# Patient Record
Sex: Male | Born: 1975 | Hispanic: Yes | Marital: Single | Smoking: Never smoker
Health system: Southern US, Community
[De-identification: ages and names within clinical notes are randomized; demographics above are authoritative.]

## PROBLEM LIST (undated history)

## (undated) DIAGNOSIS — T7840XA Allergy, unspecified, initial encounter: Secondary | ICD-10-CM

## (undated) DIAGNOSIS — K219 Gastro-esophageal reflux disease without esophagitis: Secondary | ICD-10-CM

## (undated) HISTORY — DX: Gastro-esophageal reflux disease without esophagitis: K21.9

## (undated) HISTORY — DX: Allergy, unspecified, initial encounter: T78.40XA

---

## 2013-03-06 ENCOUNTER — Ambulatory Visit (INDEPENDENT_AMBULATORY_CARE_PROVIDER_SITE_OTHER): Payer: Self-pay | Admitting: Adult Health

## 2013-03-06 ENCOUNTER — Encounter: Payer: Self-pay | Admitting: Adult Health

## 2013-03-06 VITALS — BP 130/76 | HR 58 | Temp 98.2°F | Resp 12 | Ht 67.25 in | Wt 166.0 lb

## 2013-03-06 DIAGNOSIS — R1013 Epigastric pain: Secondary | ICD-10-CM

## 2013-03-06 DIAGNOSIS — K219 Gastro-esophageal reflux disease without esophagitis: Secondary | ICD-10-CM | POA: Insufficient documentation

## 2013-03-06 MED ORDER — RANITIDINE HCL 150 MG PO TABS: 150.0000 mg | ORAL_TABLET | Freq: Two times a day (BID) | ORAL | Status: DC

## 2013-03-06 MED ORDER — OMEPRAZOLE MAGNESIUM 20 MG PO TBEC: DELAYED_RELEASE_TABLET | ORAL | Status: DC

## 2013-03-06 NOTE — Assessment & Plan Note (Signed)
Start prilosec 20 mg bid, zantac 150 mg bid. Check cbc w/diff and H.pylori stool antigen. Eliminate foods that patient finds irritating. Try to eat at least every 2-3 hours. If no improvement in therapy within 4-8 weeks will refer to GI.

## 2013-03-06 NOTE — Progress Notes (Signed)
Subjective:    Patient ID: Steve Cline, male    DOB: 06/04/1976, 37 y.o.   MRN: [id number]  HPI  Patient is a pleasant Hispanic male who presents to clinic to establish care and also to discuss symptoms of GERD. He reports that two weeks ago he began to have symptoms of acid reflux and bitter, acid taste in his mouth. He started taking Tums and reports temporary relief. He was also having increased belching and bloating. The symptoms occur shortly after eating a meal. He was seen in [place] approximately 2 years ago and diagnosed with H. pylori.  He reports being treated for same and symptoms resolved. Elita Quick has started taking Prilosec OTC 20 mg in the morning recently.   Past Medical History  Diagnosis Date  . Allergy   . GERD (gastroesophageal reflux disease)     Family History  Problem Relation Age of Onset  . Diabetes Mother   . Hyperlipidemia Mother   . Hypertension Mother     History   Social History  . Marital Status: Single    Spouse Name: N/A    Number of Children: N/A  . Years of Education: N/A   Occupational History  . Not on file.   Social History Main Topics  . Smoking status: Never Smoker   . Smokeless tobacco: Never Used  . Alcohol Use: Yes  . Drug Use: No  . Sexually Active: Not on file   Other Topics Concern  . Not on file   Social History Narrative  . No narrative on file     Review of Systems  Constitutional: Positive for fatigue. Negative for fever.  HENT: Negative.  Negative for voice change.   Respiratory: Negative.   Cardiovascular: Negative.   Gastrointestinal: Positive for constipation. Negative for nausea, abdominal pain, diarrhea and blood in stool.       Acid taste in mouth  Genitourinary: Negative.   Neurological: Negative.   Psychiatric/Behavioral: Negative.   All other systems reviewed and are negative.   BP 130/76  Pulse 58  Temp(Src) 98.2 F (36.8 C) (Oral)  Resp 12  Ht 5' 7.25" (1.708 m)  Wt 166 lb (75.297  kg)  BMI 25.81 kg/m2  SpO2 98%    Objective:   Physical Exam  Constitutional: He is oriented to person, place, and time. He appears well-developed and well-nourished. No distress.  HENT:  Head: Normocephalic and atraumatic.  Right Ear: External ear normal.  Left Ear: External ear normal.  Nose: Nose normal.  Mouth/Throat: Oropharynx is clear and moist.  Eyes: Conjunctivae and EOM are normal. Pupils are equal, round, and reactive to light.  Neck: Normal range of motion. Neck supple. No tracheal deviation present.  Cardiovascular: Normal rate, regular rhythm, normal heart sounds and intact distal pulses.  Exam reveals no gallop and no friction rub.   No murmur heard. Pulmonary/Chest: Effort normal and breath sounds normal. No respiratory distress. He has no wheezes. He has no rales. He exhibits no tenderness.  Abdominal: Soft. Bowel sounds are normal. He exhibits no distension and no mass. There is no tenderness. There is no rebound and no guarding.  Musculoskeletal: Normal range of motion. He exhibits no edema and no tenderness.  Lymphadenopathy:    He has no cervical adenopathy.  Neurological: He is alert and oriented to person, place, and time. He has normal reflexes.  Skin: Skin is warm and dry.  Psychiatric: He has a normal mood and affect. His behavior is normal. Judgment and  thought content normal.      Assessment & Plan:

## 2013-03-06 NOTE — Patient Instructions (Addendum)
  Start Prilosec 20 mg morning and bedtime.  Take Zantac 150 mg with breakfast and then 150 mg with dinner.  You can continue taking TUMS as needed.  Please bring back stool sample to check for H. pylori   Please have your blood work done today prior to leaving the office.  Avoid spicy foods, excessive caffeine and alcohol.  Try to eat something every 2-3 hours.

## 2013-03-07 ENCOUNTER — Other Ambulatory Visit: Payer: Self-pay | Admitting: Adult Health

## 2013-03-07 LAB — CBC WITH DIFFERENTIAL/PLATELET
Basophils Absolute: 0.1 10*3/uL (ref 0.0–0.1)
Eosinophils Absolute: 0.1 10*3/uL (ref 0.0–0.7)
Eosinophils Relative: 0.9 % (ref 0.0–5.0)
MCHC: 33.5 g/dL (ref 30.0–36.0)
MCV: 92 fl (ref 78.0–100.0)
Monocytes Absolute: 0.4 10*3/uL (ref 0.1–1.0)
Neutrophils Relative %: 55.2 % (ref 43.0–77.0)
Platelets: 151 10*3/uL (ref 150.0–400.0)
RDW: 13.1 % (ref 11.5–14.6)
WBC: 6 10*3/uL (ref 4.5–10.5)

## 2013-03-08 ENCOUNTER — Encounter: Payer: Self-pay | Admitting: *Deleted

## 2013-03-08 LAB — HELICOBACTER PYLORI  SPECIAL ANTIGEN: H. PYLORI Antigen: NEGATIVE

## 2013-03-15 ENCOUNTER — Telehealth: Payer: Self-pay | Admitting: Adult Health

## 2013-03-15 NOTE — Telephone Encounter (Signed)
LMTCB

## 2013-03-15 NOTE — Telephone Encounter (Signed)
Spoke with pt informed him of results

## 2013-03-15 NOTE — Telephone Encounter (Signed)
Pt came by office and was wondering if he could get a call back regarding his labs

## 2013-04-09 ENCOUNTER — Encounter: Payer: Self-pay | Admitting: Adult Health

## 2013-04-09 ENCOUNTER — Ambulatory Visit (INDEPENDENT_AMBULATORY_CARE_PROVIDER_SITE_OTHER): Payer: Self-pay | Admitting: Adult Health

## 2013-04-09 ENCOUNTER — Encounter: Payer: Self-pay | Admitting: Emergency Medicine

## 2013-04-09 VITALS — BP 110/62 | HR 53 | Temp 98.1°F | Resp 12 | Ht 67.25 in | Wt 160.5 lb

## 2013-04-09 DIAGNOSIS — K219 Gastro-esophageal reflux disease without esophagitis: Secondary | ICD-10-CM

## 2013-04-09 DIAGNOSIS — R1013 Epigastric pain: Secondary | ICD-10-CM

## 2013-04-09 DIAGNOSIS — K3189 Other diseases of stomach and duodenum: Secondary | ICD-10-CM

## 2013-04-09 MED ORDER — SUCRALFATE 1 G PO TABS: 1.0000 g | ORAL_TABLET | Freq: Four times a day (QID) | ORAL | Status: DC

## 2013-04-09 MED ORDER — DEXLANSOPRAZOLE 60 MG PO CPDR: 60.0000 mg | DELAYED_RELEASE_CAPSULE | Freq: Every day | ORAL | Status: DC

## 2013-04-09 NOTE — Progress Notes (Signed)
  Subjective:    Patient ID: Steve Cline, male    DOB: 1975/12/17, 37 y.o.   MRN: [id number]  HPI  Patient presents for follow up of dyspepsia. He was taking omeprazole and zantac but has found that his symptoms are not improved. He has been taking some natural supplements but symptoms are not improved with these either. Symptoms are only improved temporarily. He reports occasional pain after meals but his most bothersome symptom is acid reflux. Patient has stopped taking the omeprazole and Zantac.   Review of Systems  Gastrointestinal: Negative for nausea, vomiting, diarrhea and blood in stool.       Epigastric discomfort and acid reflux       Objective:   Physical Exam  Constitutional: He is oriented to person, place, and time. He appears well-developed and well-nourished. No distress.  Cardiovascular: Normal rate and regular rhythm.   Pulmonary/Chest: Effort normal. No respiratory distress.  Abdominal: Soft. Bowel sounds are normal. He exhibits no distension and no mass. There is tenderness. There is no rebound and no guarding.  Tenderness upon palpating epigastric area  Musculoskeletal: Normal range of motion.  Neurological: He is alert and oriented to person, place, and time.  Psychiatric: He has a normal mood and affect. His behavior is normal. Judgment and thought content normal.          Assessment & Plan:

## 2013-04-09 NOTE — Patient Instructions (Addendum)
  Start Dexilant 60 mg daily. Take on an empty stomach 30 minutes before anything.  You will also take Carafate 1g tablets 4 times a day.  I am going to refer you to a GI specialist

## 2013-04-09 NOTE — Assessment & Plan Note (Signed)
Provided sample of dexilent and gave prescription for carafate. Refer to GI.

## 2013-04-16 ENCOUNTER — Ambulatory Visit: Payer: Self-pay | Admitting: Adult Health

## 2013-06-14 ENCOUNTER — Ambulatory Visit: Payer: Self-pay | Admitting: Unknown Physician Specialty

## 2013-06-18 LAB — PATHOLOGY REPORT

## 2013-08-20 ENCOUNTER — Telehealth: Payer: Self-pay | Admitting: Adult Health

## 2013-08-20 NOTE — Telephone Encounter (Signed)
Pt came in stating raquel wanted to know what [facility] gave him   The meds was omeprazole dr 40mg 

## 2013-08-21 ENCOUNTER — Other Ambulatory Visit: Payer: Self-pay | Admitting: Adult Health

## 2013-08-21 NOTE — Telephone Encounter (Signed)
Please have patient come by the office for samples of nexium. He is paying $200 for omeprazole 40 mg

## 2013-08-22 NOTE — Telephone Encounter (Signed)
Pt notified, samples left up front

## 2013-09-20 ENCOUNTER — Encounter: Payer: Self-pay | Admitting: Adult Health

## 2013-09-20 ENCOUNTER — Ambulatory Visit (INDEPENDENT_AMBULATORY_CARE_PROVIDER_SITE_OTHER): Payer: Self-pay | Admitting: Adult Health

## 2013-09-20 VITALS — BP 106/68 | HR 56 | Temp 98.6°F | Resp 14 | Wt 169.0 lb

## 2013-09-20 DIAGNOSIS — K921 Melena: Secondary | ICD-10-CM | POA: Insufficient documentation

## 2013-09-20 DIAGNOSIS — R109 Unspecified abdominal pain: Secondary | ICD-10-CM | POA: Insufficient documentation

## 2013-09-20 LAB — HEPATIC FUNCTION PANEL
ALT: 36 U/L (ref 0–53)
AST: 30 U/L (ref 0–37)
Albumin: 4.2 g/dL (ref 3.5–5.2)
Alkaline Phosphatase: 46 U/L (ref 39–117)
Bilirubin, Direct: 0.2 mg/dL (ref 0.0–0.3)
Indirect Bilirubin: 0.7 mg/dL (ref 0.2–1.2)
TOTAL PROTEIN: 6.9 g/dL (ref 6.0–8.3)
Total Bilirubin: 0.9 mg/dL (ref 0.2–1.2)

## 2013-09-20 LAB — BASIC METABOLIC PANEL
BUN: 16 mg/dL (ref 6–23)
CHLORIDE: 105 meq/L (ref 96–112)
CO2: 22 mEq/L (ref 19–32)
Calcium: 9 mg/dL (ref 8.4–10.5)
Creat: 0.86 mg/dL (ref 0.50–1.35)
Glucose, Bld: 98 mg/dL (ref 70–99)
Potassium: 4.1 mEq/L (ref 3.5–5.3)
SODIUM: 138 meq/L (ref 135–145)

## 2013-09-20 MED ORDER — POLYETHYLENE GLYCOL 3350 17 GM/SCOOP PO POWD: 17.0000 g | Freq: Every day | ORAL | Status: AC

## 2013-09-20 NOTE — Progress Notes (Signed)
Pre visit review using our clinic review tool, if applicable. No additional management support is needed unless otherwise documented below in the visit note. 

## 2013-09-20 NOTE — Progress Notes (Signed)
Patient ID: Steve Cline, male   DOB: 09-09-1975, 38 y.o.   MRN: [id number]    Subjective:    Patient ID: Steve Cline, male    DOB: 09-09-1975, 38 y.o.   MRN: [id number]  HPI  Steve Cline is a very pleasant Hispanic male who presents to clinic with ongoing GI symptoms. I have been seeing him for epigastric pain and referred him to Dr. Mechele CollinElliott for further evaluation. He is s/p endoscopy which was normal. Steve Cline is currently taking Nexium with good results for his GERD. For the last several months he has been experiencing bloating, abdominal discomfort. He reports moving his bowels regularly but lately he has been very constipated. He has seen blood in his stool several times. Denies hx of hemorrhoids.    Past Medical History  Diagnosis Date  . Allergy   . GERD (gastroesophageal reflux disease)     No past surgical history on file.   Family History  Problem Relation Age of Onset  . Diabetes Mother   . Hyperlipidemia Mother   . Hypertension Mother     History   Social History  . Marital Status: Single    Spouse Name: N/A    Number of Children: N/A  . Years of Education: N/A   Occupational History  . Not on file.   Social History Main Topics  . Smoking status: Never Smoker   . Smokeless tobacco: Never Used  . Alcohol Use: Yes  . Drug Use: No  . Sexual Activity: Not on file   Other Topics Concern  . Not on file   Social History Narrative  . No narrative on file    Review of Systems  Respiratory: Negative.   Cardiovascular: Negative.   Gastrointestinal: Positive for constipation, blood in stool and abdominal distention. Negative for nausea, vomiting, diarrhea and rectal pain. Abdominal pain: abdominal discomfort.  Genitourinary: Negative.   Neurological: Negative.   All other systems reviewed and are negative.       Objective:  BP 106/68  Pulse 56  Temp(Src) 98.6 F (37 C) (Oral)  Resp 14  Wt 169 lb (76.658 kg)  SpO2 97%   Physical Exam    Constitutional: He is oriented to person, place, and time. He appears well-developed and well-nourished. No distress.  Cardiovascular: Normal rate and regular rhythm.   Pulmonary/Chest: Effort normal. No respiratory distress.  Abdominal: Soft. Bowel sounds are normal. He exhibits distension (mild distention). He exhibits no mass. There is tenderness. There is no rebound and no guarding.  Musculoskeletal: Normal range of motion.  Neurological: He is alert and oriented to person, place, and time.  Psychiatric: He has a normal mood and affect. His behavior is normal. Judgment and thought content normal.       Assessment & Plan:   1. Blood in stool Reports problems with constipation, bloating, discomfort. Has seen blood in stool several times. No hx of hemorrhoids. I am referring him back to see Dr. Mechele CollinElliott for further evaluation and recommendations. May need colonoscopy.  - Ambulatory referral to Gastroenterology  2. Abdominal pain Abdomen is tender with deep palpation in several quadrants. He has been experiencing bloating for the last several months. Check labs. Refer to GI for further eval  - Hepatic function panel - Basic metabolic panel

## 2013-09-20 NOTE — Patient Instructions (Signed)
  Empieza a tomar Miralax diariamente.  Tomalo en ayuna con una bebida caliente.

## 2013-09-23 ENCOUNTER — Encounter: Payer: Self-pay | Admitting: *Deleted

## 2013-10-02 ENCOUNTER — Telehealth: Payer: Self-pay | Admitting: Adult Health

## 2013-10-02 NOTE — Telephone Encounter (Signed)
The patient left a voice mail he not feeling well wants to be seen today.

## 2013-10-04 ENCOUNTER — Ambulatory Visit: Payer: Self-pay | Admitting: Unknown Physician Specialty

## 2013-10-04 NOTE — Telephone Encounter (Signed)
R. Rey spoke to pt

## 2013-10-08 ENCOUNTER — Ambulatory Visit: Payer: Self-pay | Admitting: Unknown Physician Specialty

## 2013-10-25 ENCOUNTER — Ambulatory Visit: Payer: Self-pay | Admitting: Unknown Physician Specialty

## 2013-10-30 LAB — PATHOLOGY REPORT

## 2013-11-13 ENCOUNTER — Ambulatory Visit: Payer: Self-pay | Admitting: Unknown Physician Specialty

## 2015-07-25 IMAGING — US ABDOMEN ULTRASOUND LIMITED
1 series · 14 of 25 positions shown · non-contrast
Comparison: CT ABD-PELV W/ CM dated 10/04/2013

CLINICAL DATA: Abdominal pain.  Abnormal CT scan in.

EXAM:
US ABDOMEN LIMITED - RIGHT UPPER QUADRANT

[Series 1: abdomen ultrasound limited · 0.31mm/px · 67 acquisitions, 14 frames shown]
[im 1/67]
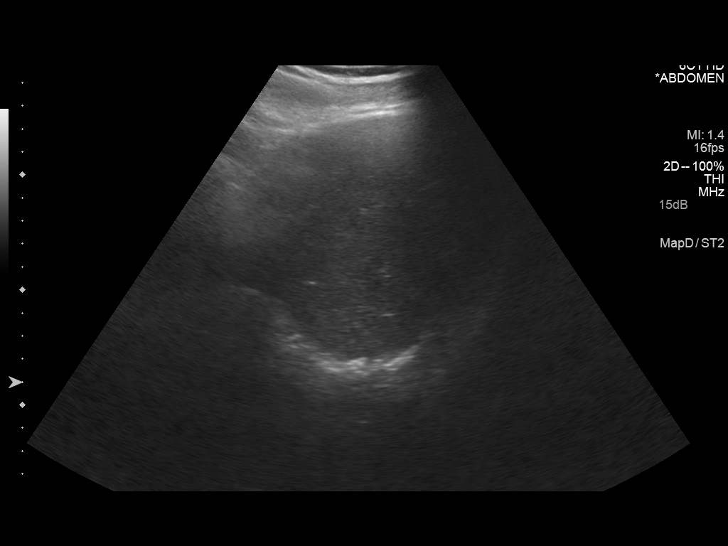
[im 6/67]
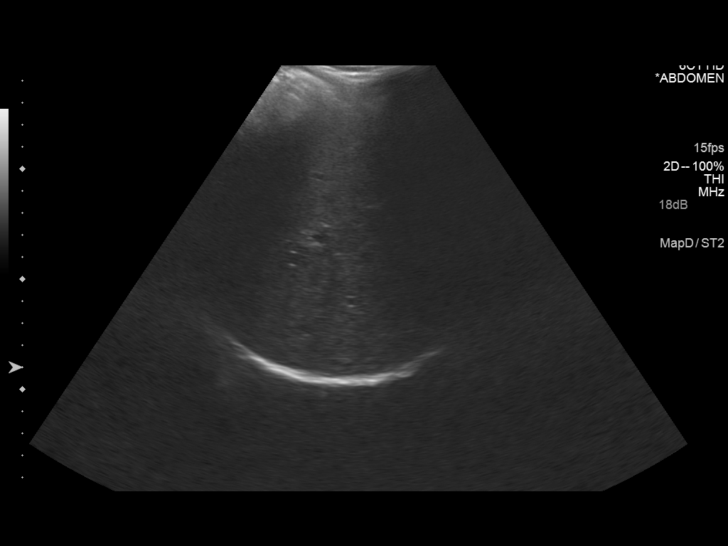
[im 12/67]
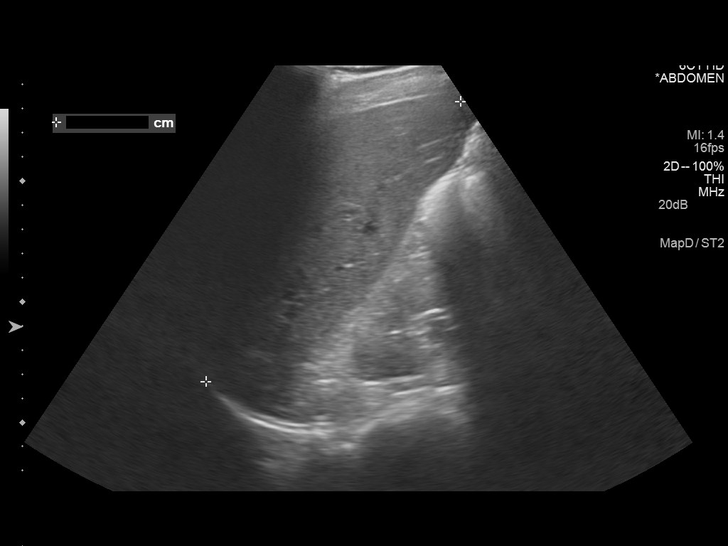
[im 17/67]
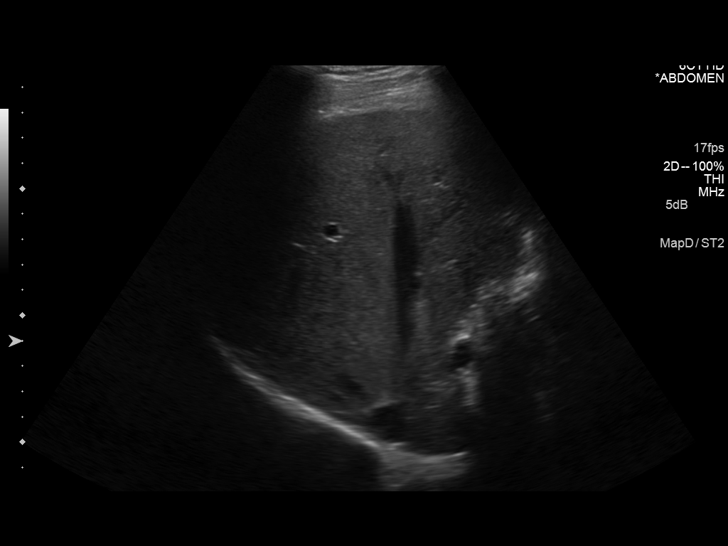
[im 23/67]
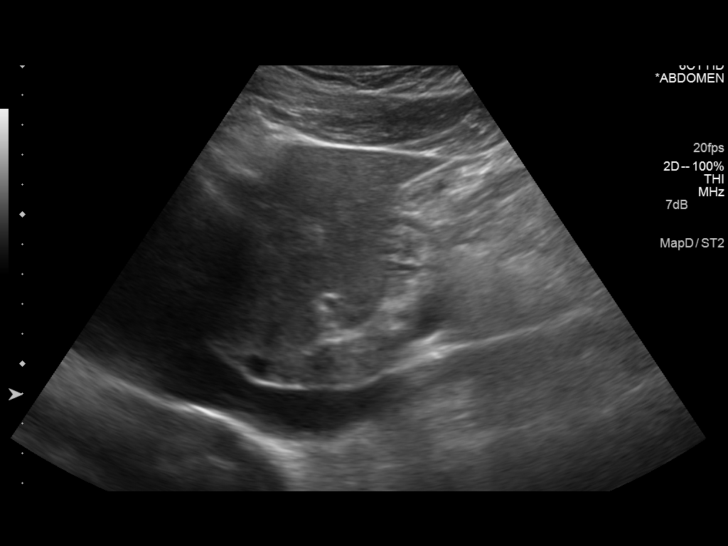
[im 25/67]
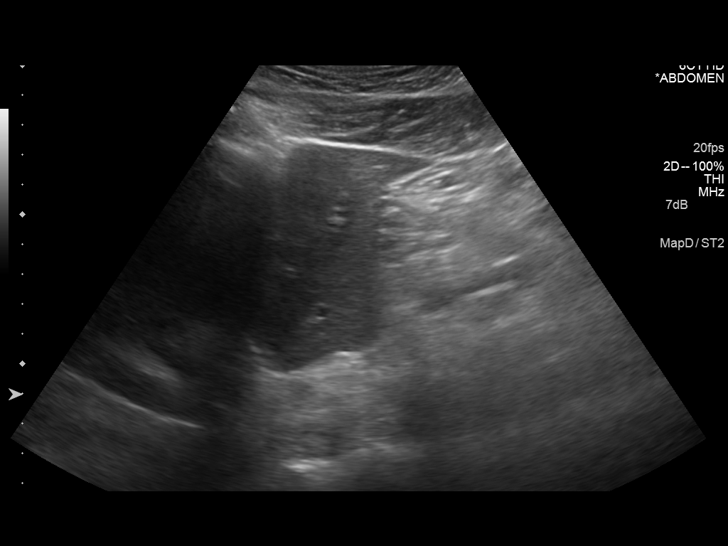
[im 31/67]
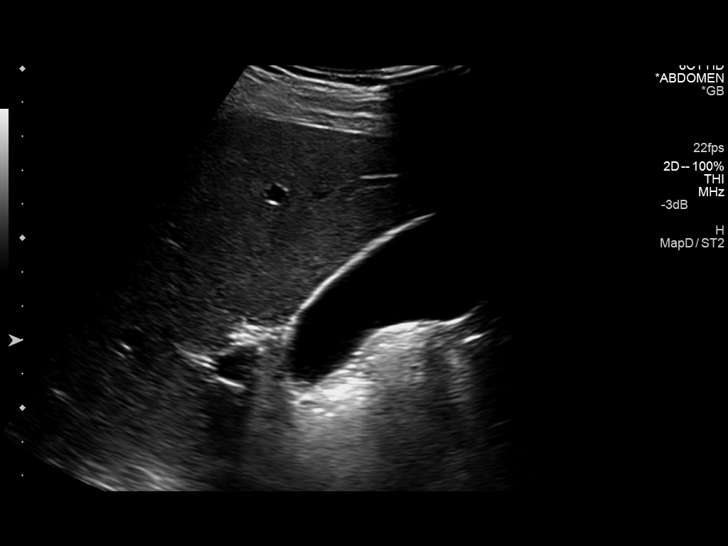
[im 36/67]
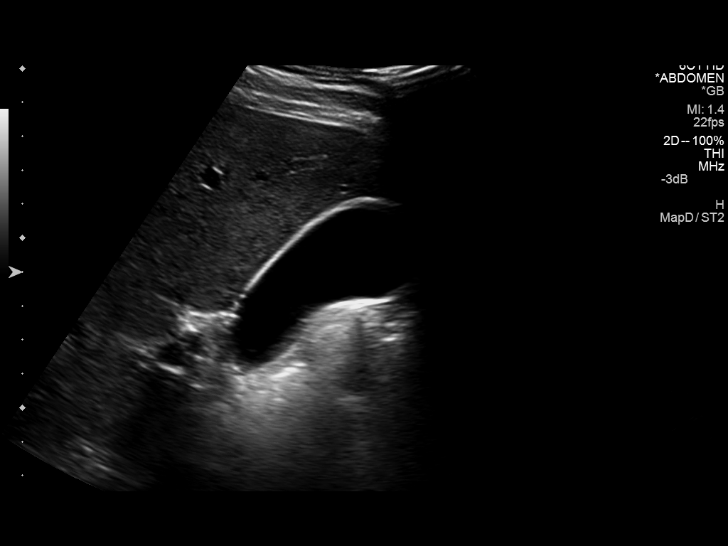
[im 42/67]
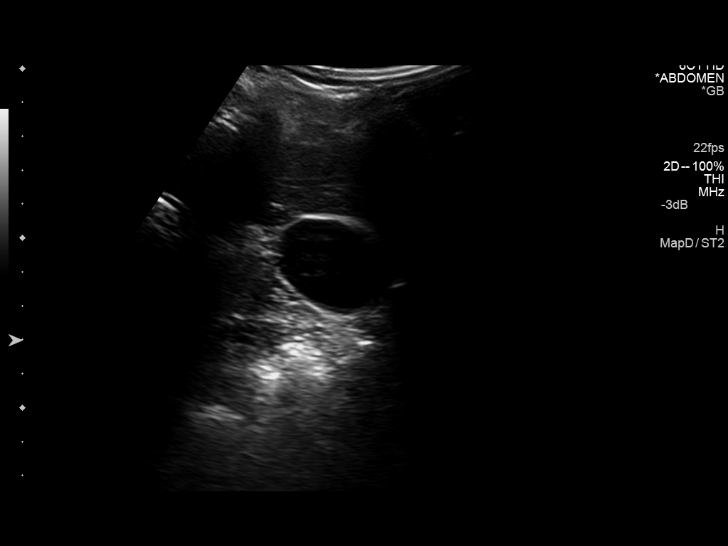
[im 45/67]
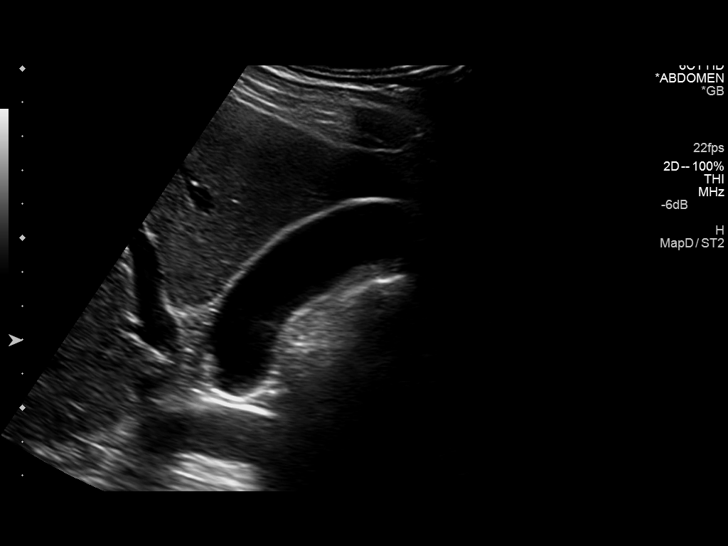
[im 50/67]
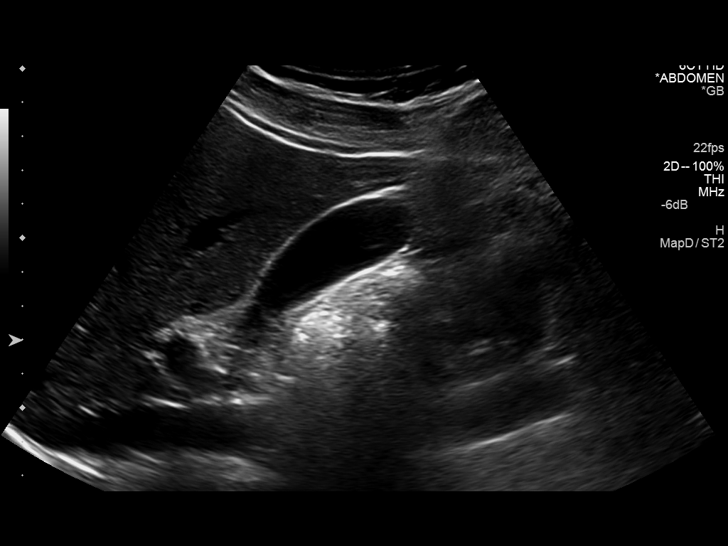
[im 56/67]
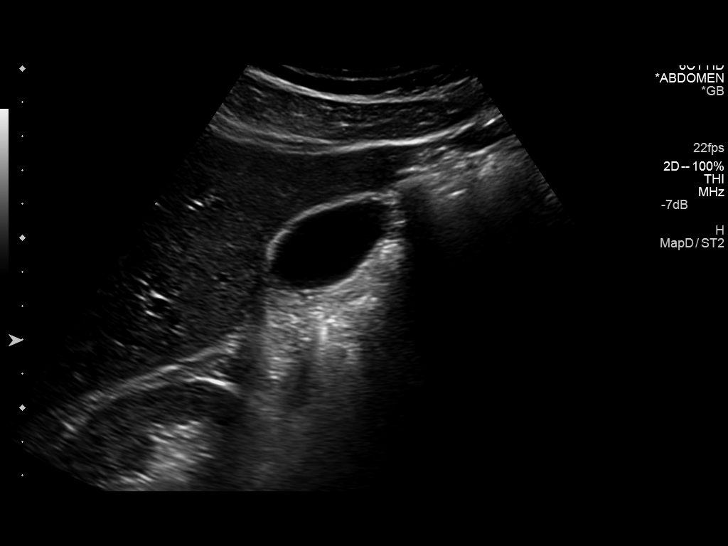
[im 61/67]
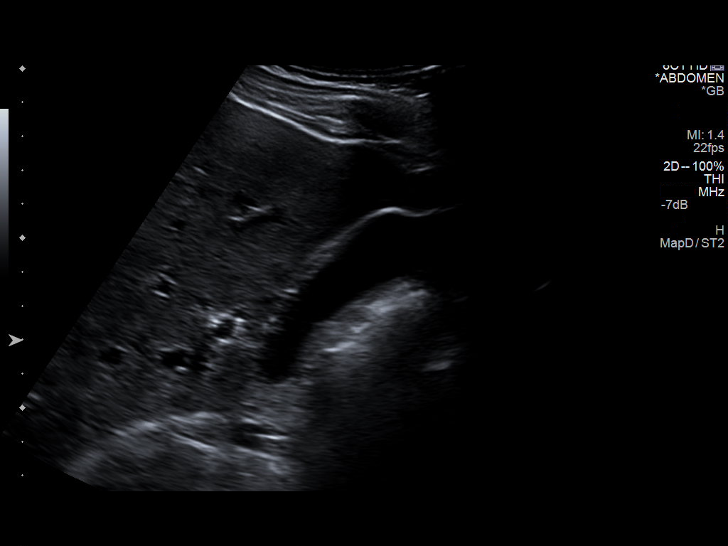
[im 67/67]
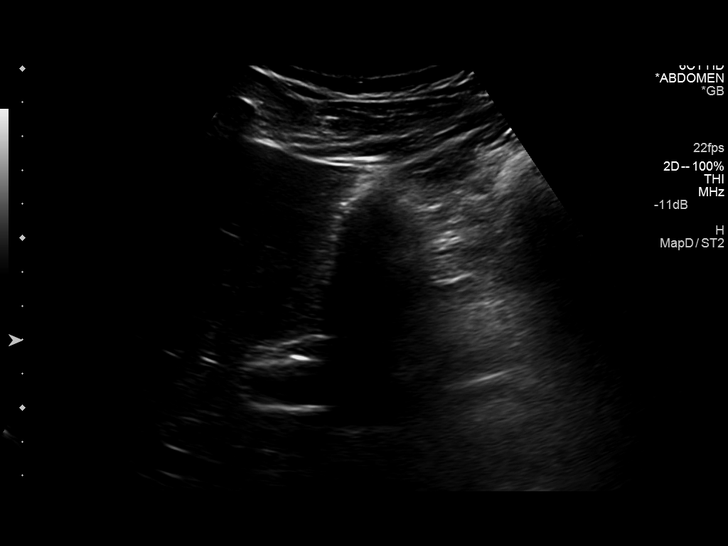

[14 of 25 positions shown; findings below may reference images not displayed]

FINDINGS: Gallbladder:

There is no calcification of the gallbladder wall identified
sonographically. No stones, sludge, pericholecystic fluid or wall
thickening. No sonographic Murphy sign.

Common bile duct:

Diameter: 4 mm, normal.

Liver:

No focal lesion identified. Within normal limits in parenchymal
echogenicity.
IMPRESSION: No gallbladder wall calcification identified sonographically. Normal
right upper quadrant ultrasound.
